# Patient Record
Sex: Male | Born: 1960 | Race: White | Hispanic: No | Marital: Married | Smoking: Never smoker
Health system: Southern US, Community
[De-identification: ages and names within clinical notes are randomized; demographics above are authoritative.]

## PROBLEM LIST (undated history)

## (undated) DIAGNOSIS — K219 Gastro-esophageal reflux disease without esophagitis: Secondary | ICD-10-CM

## (undated) DIAGNOSIS — I677 Cerebral arteritis, not elsewhere classified: Secondary | ICD-10-CM

## (undated) DIAGNOSIS — E079 Disorder of thyroid, unspecified: Secondary | ICD-10-CM

## (undated) DIAGNOSIS — E785 Hyperlipidemia, unspecified: Secondary | ICD-10-CM

---

## 2010-08-22 ENCOUNTER — Emergency Department (HOSPITAL_BASED_OUTPATIENT_CLINIC_OR_DEPARTMENT_OTHER): Payer: BC Managed Care – PPO

## 2010-08-22 ENCOUNTER — Emergency Department (INDEPENDENT_AMBULATORY_CARE_PROVIDER_SITE_OTHER): Payer: BC Managed Care – PPO

## 2010-08-22 ENCOUNTER — Emergency Department (HOSPITAL_BASED_OUTPATIENT_CLINIC_OR_DEPARTMENT_OTHER)
Admission: EM | Admit: 2010-08-22 | Discharge: 2010-08-22 | Disposition: A | Discharge status: Other Acute Inpatient Hospital | Payer: BC Managed Care – PPO | Attending: Emergency Medicine | Admitting: Emergency Medicine

## 2010-08-22 DIAGNOSIS — K859 Acute pancreatitis without necrosis or infection, unspecified: Secondary | ICD-10-CM | POA: Insufficient documentation

## 2010-08-22 DIAGNOSIS — R1013 Epigastric pain: Secondary | ICD-10-CM | POA: Insufficient documentation

## 2010-08-22 DIAGNOSIS — R0789 Other chest pain: Secondary | ICD-10-CM | POA: Insufficient documentation

## 2010-08-22 DIAGNOSIS — E7150 Peroxisomal disorder, unspecified: Secondary | ICD-10-CM | POA: Insufficient documentation

## 2010-08-22 DIAGNOSIS — R0609 Other forms of dyspnea: Secondary | ICD-10-CM | POA: Insufficient documentation

## 2010-08-22 DIAGNOSIS — E039 Hypothyroidism, unspecified: Secondary | ICD-10-CM | POA: Insufficient documentation

## 2010-08-22 DIAGNOSIS — R0989 Other specified symptoms and signs involving the circulatory and respiratory systems: Secondary | ICD-10-CM | POA: Insufficient documentation

## 2010-08-22 DIAGNOSIS — K219 Gastro-esophageal reflux disease without esophagitis: Secondary | ICD-10-CM | POA: Insufficient documentation

## 2010-08-22 DIAGNOSIS — R079 Chest pain, unspecified: Secondary | ICD-10-CM

## 2010-08-22 LAB — COMPREHENSIVE METABOLIC PANEL
ALT: 84 U/L — ABNORMAL HIGH (ref 0–53)
AST: 51 U/L — ABNORMAL HIGH (ref 0–37)
Albumin: 4.9 g/dL (ref 3.5–5.2)
CO2: 27 mEq/L (ref 19–32)
Calcium: 9.5 mg/dL (ref 8.4–10.5)
Chloride: 106 mEq/L (ref 96–112)
GFR calc Af Amer: >60 mL/min (ref >60)
GFR calc non Af Amer: >60 mL/min (ref >60)
Sodium: 145 mEq/L (ref 135–145)
Total Bilirubin: 0.7 mg/dL (ref 0.3–1.2)

## 2010-08-22 LAB — DIFFERENTIAL
Basophils Absolute: 0 10*3/uL (ref 0.0–0.1)
Basophils Relative: 0 % (ref 0–1)
Eosinophils Absolute: 0.2 10*3/uL (ref 0.0–0.7)
Neutro Abs: 4 10*3/uL (ref 1.7–7.7)
Neutrophils Relative %: 55 % (ref 43–77)

## 2010-08-22 LAB — POCT CARDIAC MARKERS
CKMB, poc: 1.2 ng/mL (ref 1.0–8.0)
Troponin i, poc: <0.05 ng/mL (ref 0.00–0.09)

## 2010-08-22 LAB — CBC
Hemoglobin: 15.4 g/dL (ref 13.0–17.0)
Platelets: 185 10*3/uL (ref 150–400)
RBC: 4.98 MIL/uL (ref 4.22–5.81)

## 2012-09-04 ENCOUNTER — Encounter (HOSPITAL_BASED_OUTPATIENT_CLINIC_OR_DEPARTMENT_OTHER): Payer: Self-pay | Admitting: Emergency Medicine

## 2012-09-04 ENCOUNTER — Emergency Department (HOSPITAL_BASED_OUTPATIENT_CLINIC_OR_DEPARTMENT_OTHER)
Admission: EM | Admit: 2012-09-04 | Discharge: 2012-09-04 | Disposition: A | Discharge status: Home / Self Care | Payer: BC Managed Care – PPO | Attending: Emergency Medicine | Admitting: Emergency Medicine

## 2012-09-04 DIAGNOSIS — Y9239 Other specified sports and athletic area as the place of occurrence of the external cause: Secondary | ICD-10-CM | POA: Insufficient documentation

## 2012-09-04 DIAGNOSIS — S0101XA Laceration without foreign body of scalp, initial encounter: Secondary | ICD-10-CM

## 2012-09-04 DIAGNOSIS — Y9364 Activity, baseball: Secondary | ICD-10-CM | POA: Insufficient documentation

## 2012-09-04 DIAGNOSIS — E079 Disorder of thyroid, unspecified: Secondary | ICD-10-CM | POA: Insufficient documentation

## 2012-09-04 DIAGNOSIS — Z23 Encounter for immunization: Secondary | ICD-10-CM | POA: Insufficient documentation

## 2012-09-04 DIAGNOSIS — Z79899 Other long term (current) drug therapy: Secondary | ICD-10-CM | POA: Insufficient documentation

## 2012-09-04 DIAGNOSIS — S0100XA Unspecified open wound of scalp, initial encounter: Secondary | ICD-10-CM | POA: Insufficient documentation

## 2012-09-04 DIAGNOSIS — K219 Gastro-esophageal reflux disease without esophagitis: Secondary | ICD-10-CM | POA: Insufficient documentation

## 2012-09-04 DIAGNOSIS — W219XXA Striking against or struck by unspecified sports equipment, initial encounter: Secondary | ICD-10-CM | POA: Insufficient documentation

## 2012-09-04 DIAGNOSIS — E785 Hyperlipidemia, unspecified: Secondary | ICD-10-CM | POA: Insufficient documentation

## 2012-09-04 HISTORY — DX: Gastro-esophageal reflux disease without esophagitis: K21.9

## 2012-09-04 HISTORY — DX: Disorder of thyroid, unspecified: E07.9

## 2012-09-04 HISTORY — DX: Hyperlipidemia, unspecified: E78.5

## 2012-09-04 MED ORDER — TETANUS-DIPHTH-ACELL PERTUSSIS 5-2.5-18.5 LF-MCG/0.5 IM SUSP
0.5000 mL | Freq: Once | INTRAMUSCULAR | Status: AC
  Administered 2012-09-04: 0.5 mL via INTRAMUSCULAR
  Filled 2012-09-04: qty 0.5

## 2012-09-04 MED ORDER — TRAMADOL HCL 50 MG PO TABS
50.0000 mg | ORAL_TABLET | Freq: Four times a day (QID) | ORAL | Status: AC | PRN
Start: 2012-09-04 — End: ?

## 2012-09-04 NOTE — ED Notes (Signed)
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

## 2012-09-04 NOTE — ED Notes (Signed)
Pt denies loc, bleeding controlled, pt laughing talking in triage

## 2012-09-04 NOTE — ED Provider Notes (Signed)
History  This chart was scribed for Loren Racer, MD by Bennett Scrape, ED Scribe. This patient was seen in room MH03/MH03 and the patient's care was started at 8:23 PM.  CSN: [id number]  Arrival date & time 09/04/12  1946   First MD Initiated Contact with Patient 09/04/12 2023      Chief Complaint  Patient presents with  . Head Injury    Patient is a 52 y.o. male presenting with head injury. The history is provided by the patient. No language interpreter was used.  Head Injury Location:  L parietal Time since incident:  4 hours Mechanism of injury: sports   Pain details:    Severity:  No pain Chronicity:  New Ineffective treatments:  Pressure Associated symptoms: no headaches, no nausea, no neck pain and no vomiting     Derrick Dominguez is a 52 y.o. male who presents to the Emergency Department complaining of head injury after being hit in the head by a baseball that occurred 4 hours ago. 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 He denies LOC and states that he was up and moving around immediately after the incident. He has a bleeding wound to the left parietal region of the scalp but denies neck pain, HA, nausea and emesis currently. TD status is unknown. He has a h/o HLD, thyroid disease and GERD. He denies smoking and alcohol use.   Past Medical History  Diagnosis Date  . Hyperlipidemia   . Thyroid disease   . GERD (gastroesophageal reflux disease)     History reviewed. No pertinent past surgical history.  History reviewed. No pertinent family history.  History  Substance Use Topics  . Smoking status: Never Smoker   . Smokeless tobacco: Not on file  . Alcohol Use: No      Review of Systems  HENT: Negative for neck pain.   Gastrointestinal: Negative for nausea and vomiting.  Skin: Positive for wound (to the vertex of his head).  Neurological: Negative for syncope, speech difficulty, weakness and headaches.   All other systems reviewed and are negative.    Allergies  Compazine and Statins  Home Medications   Current Outpatient Rx  Name  Route  Sig  Dispense  Refill  . ezetimibe (ZETIA) 10 MG tablet   Oral   Take 10 mg by mouth daily.         Marland Kitchen levothyroxine (SYNTHROID, LEVOTHROID) 25 MCG tablet   Oral   Take 25 mcg by mouth daily.         Marland Kitchen omega-3 acid ethyl esters (LOVAZA) 1 G capsule   Oral   Take 2 g by mouth 2 (two) times daily.         Marland Kitchen omeprazole (PRILOSEC) 10 MG capsule   Oral   Take 10 mg by mouth daily.         . traMADol (ULTRAM) 50 MG tablet   Oral   Take 1 tablet (50 mg total) by mouth every 6 (six) hours as needed for pain.   15 tablet   0     There were no vitals taken for this visit.  Physical Exam  Nursing note and vitals reviewed. Constitutional: He is oriented to person, place, and time. He appears well-developed and well-nourished. No distress.  HENT:  Head: Normocephalic.  2 cm laceration to the left parietal, no signs of contamination   Eyes: Conjunctivae and EOM are normal. Pupils are  equal, round, and reactive to light.  Neck: Normal range of motion. Neck supple. No tracheal deviation present.  Cardiovascular: Normal rate.   Pulmonary/Chest: Effort normal. No respiratory distress.  Musculoskeletal: Normal range of motion.  Neurological: He is alert and oriented to person, place, and time.  Moves all extremities, 5/5 strength in all extremities, sensation intact  Skin: Skin is warm and dry.  Psychiatric: He has a normal mood and affect. His behavior is normal.    ED Course  Procedures (including critical care time)  DIAGNOSTIC STUDIES: None performed    COORDINATION OF CARE: 8:33 PM-Had extensive conversation addressing pt's concerns and given signs to return for concussion and infection. Discussed treatment plan which includes 3 staples with pt at bedside and pt agreed to plan. Advised to return in one week for staple  removal.  LACERATION REPAIR PROCEDURE NOTE The patient's identification was confirmed and consent was obtained. This procedure was performed by Loren Racer, MD at 8:37 PM. Site: left parietal region of the scalp Sterile procedures observed Anesthetic used (type and amt): none with pt's consent Suture type/size: staples Length: 2 cm # of Sutures: 3 Technique:well approximated Complexity Antibx ointment applied Tetanus ordered Site anesthetized, irrigated with NS, explored without evidence of foreign body, wound well approximated, site covered with dry, sterile dressing.  Patient tolerated procedure well without complications. Instructions for care discussed verbally and patient provided with additional written instructions for homecare and f/u.  Labs Reviewed - No data to display No results found.   1. Scalp laceration       MDM  I personally performed the services described in this documentation, which was scribed in my presence. The recorded information has been reviewed and is accurate.    Loren Racer, MD 09/04/12 [phone]

## 2012-09-04 NOTE — ED Notes (Signed)
Pt also takes lavalo daily for cholesterol

## 2017-09-27 ENCOUNTER — Encounter (HOSPITAL_BASED_OUTPATIENT_CLINIC_OR_DEPARTMENT_OTHER): Payer: Self-pay | Admitting: Emergency Medicine

## 2017-09-27 ENCOUNTER — Other Ambulatory Visit: Payer: Self-pay

## 2017-09-27 ENCOUNTER — Emergency Department (HOSPITAL_BASED_OUTPATIENT_CLINIC_OR_DEPARTMENT_OTHER)
Admission: EM | Admit: 2017-09-27 | Discharge: 2017-09-28 | Disposition: A | Discharge status: Home / Self Care | Payer: BLUE CROSS/BLUE SHIELD | Attending: Emergency Medicine | Admitting: Emergency Medicine

## 2017-09-27 ENCOUNTER — Emergency Department (HOSPITAL_BASED_OUTPATIENT_CLINIC_OR_DEPARTMENT_OTHER): Payer: BLUE CROSS/BLUE SHIELD

## 2017-09-27 DIAGNOSIS — R51 Headache: Secondary | ICD-10-CM | POA: Diagnosis not present

## 2017-09-27 DIAGNOSIS — R519 Headache, unspecified: Secondary | ICD-10-CM

## 2017-09-27 DIAGNOSIS — Z79899 Other long term (current) drug therapy: Secondary | ICD-10-CM | POA: Diagnosis not present

## 2017-09-27 HISTORY — DX: Cerebral arteritis, not elsewhere classified: I67.7

## 2017-09-27 LAB — CBC WITH DIFFERENTIAL/PLATELET
BASOS ABS: 0 10*3/uL (ref 0.0–0.1)
Basophils Relative: 0 %
EOS ABS: 0.2 10*3/uL (ref 0.0–0.7)
EOS PCT: 2 %
HCT: 41.7 % (ref 39.0–52.0)
Hemoglobin: 14.5 g/dL (ref 13.0–17.0)
LYMPHS ABS: 2 10*3/uL (ref 0.7–4.0)
LYMPHS PCT: 31 %
MCH: 31.3 pg (ref 26.0–34.0)
MCHC: 34.8 g/dL (ref 30.0–36.0)
MCV: 89.9 fL (ref 78.0–100.0)
Monocytes Absolute: 0.7 10*3/uL (ref 0.1–1.0)
Monocytes Relative: 11 %
Neutro Abs: 3.4 10*3/uL (ref 1.7–7.7)
Neutrophils Relative %: 56 %
PLATELETS: 144 10*3/uL — AB (ref 150–400)
RBC: 4.64 MIL/uL (ref 4.22–5.81)
RDW: 12.4 % (ref 11.5–15.5)
WBC: 6.3 10*3/uL (ref 4.0–10.5)

## 2017-09-27 LAB — COMPREHENSIVE METABOLIC PANEL
ALK PHOS: 49 U/L (ref 38–126)
ALT: 59 U/L (ref 17–63)
AST: 30 U/L (ref 15–41)
Albumin: 4.8 g/dL (ref 3.5–5.0)
Anion gap: 8 (ref 5–15)
BUN: 14 mg/dL (ref 6–20)
CALCIUM: 9.1 mg/dL (ref 8.9–10.3)
CHLORIDE: 104 mmol/L (ref 101–111)
CO2: 28 mmol/L (ref 22–32)
CREATININE: 1.11 mg/dL (ref 0.61–1.24)
GFR calc Af Amer: >60 mL/min (ref >60)
GFR calc non Af Amer: >60 mL/min (ref >60)
Glucose, Bld: 130 mg/dL — ABNORMAL HIGH (ref 65–99)
Potassium: 3.5 mmol/L (ref 3.5–5.1)
SODIUM: 140 mmol/L (ref 135–145)
Total Bilirubin: 0.4 mg/dL (ref 0.3–1.2)
Total Protein: 7.5 g/dL (ref 6.5–8.1)

## 2017-09-27 MED ORDER — IOPAMIDOL (ISOVUE-370) INJECTION 76%
100.0000 mL | Freq: Once | INTRAVENOUS | Status: AC | PRN
  Administered 2017-09-27: 100 mL via INTRAVENOUS

## 2017-09-27 MED ORDER — SODIUM CHLORIDE 0.9 % IV BOLUS (SEPSIS)
1000.0000 mL | Freq: Once | INTRAVENOUS | Status: AC
  Administered 2017-09-27: 1000 mL via INTRAVENOUS

## 2017-09-27 NOTE — ED Notes (Signed)
Alert, NAD, calm, interactive, resps e/u, speaking in clear complete sentences, no dyspnea noted, skin W&D, VSS, c/o HA, onset Wednesday, worse at onset, better now, fluctuates in intensity, 3/10 now, usual trigeminal HA on R, this left sided head pain is new, has neurologist (denies: other sx including, other pain, sob, numbness, tingling, NVD, fever, dizziness or visual changes). [facility]

## 2017-09-27 NOTE — ED Triage Notes (Signed)
Patient states that 2 days ago he had 2 "thunderclaps" from the headache. He reports that he called his neurologist, because he was starting to feel like he is getting a "squeezing" feeling to his left head.

## 2017-09-27 NOTE — ED Provider Notes (Signed)
[facility] EMERGENCY DEPARTMENT Provider Note   CSN: [id number] Arrival date & time: 09/27/17  2010     History   Chief Complaint Chief Complaint  Patient presents with  . Headache    HPI Derrick Dominguez is a 57 y.o. male.  Patient is a 57 year old male with a history of thyroid disease, hyperlipidemia, cerebral vasculitis, prior cluster headaches and trigeminal neuralgia mostly on the right side of his head presenting today with intermittent headaches since Wednesday.  Patient states on Wednesday he had 2 episodes of severe pain in the left side of his head that felt like somebody was squeezing to the point it crippled him and he could not move.  He had 2 episodes back to back that lasted a minute or 2 at the most.  He states once the pain started relieving it was associated with a burning sensation.  Patient states that he did not have another episode until today.  He talked with his neurologist who recommended he come and be evaluated.  Patient states that he constantly has pain on the right side of his face from the cluster headaches and trigeminal neuralgia.  He takes gabapentin for this but that has not helped the pain on the left side.  Currently the pain is minimal and just a mild squeezing.  He denies any vision changes, nausea, vomiting, fever, unilateral weakness, numbness or speech changes.  He denies any neck pain, new trauma.  no recent medication changes, lack of sleep or increased stress.  Patient has no known history of clotting disorders and he has had prior MRIs and CT/CTA without acute findings in the last 4 years.   The history is provided by the patient and the spouse.  Headache   This is a recurrent problem. The current episode started 2 days ago. The problem has been rapidly improving. The headache is associated with nothing. The pain is located in the left unilateral region. The quality of the pain is described as throbbing (squeezing). The pain is at a  severity of 3/10. The pain is mild (earlier it was severe.). The pain does not radiate. Pertinent negatives include no anorexia, no fever, no palpitations, no syncope, no shortness of breath, no nausea and no vomiting. Treatments tried: gabapentin. The treatment provided no relief.    Past Medical History:  Diagnosis Date  . Cerebral vasculitis   . GERD (gastroesophageal reflux disease)   . Hyperlipidemia   . Thyroid disease     There are no active problems to display for this patient.   History reviewed. No pertinent surgical history.     Home Medications    Prior to Admission medications   Medication Sig Start Date End Date Taking? Authorizing Provider  gabapentin (NEURONTIN) 300 MG capsule Take 300 mg by mouth 3 (three) times daily.   Yes [provider]  Pitavastatin Calcium (LIVALO) 2 MG TABS Take by mouth.   Yes [provider]  ezetimibe (ZETIA) 10 MG tablet Take 10 mg by mouth daily.    [provider]  levothyroxine (SYNTHROID, LEVOTHROID) 25 MCG tablet Take 25 mcg by mouth daily.    [provider]  omega-3 acid ethyl esters (LOVAZA) 1 G capsule Take 2 g by mouth 2 (two) times daily.    [provider]  omeprazole (PRILOSEC) 10 MG capsule Take 10 mg by mouth daily.    [provider]  traMADol (ULTRAM) 50 MG tablet Take 1 tablet (50 mg total) by mouth every  6 (six) hours as needed for pain. 09/04/12   Loren Racer, MD    Family History History reviewed. No pertinent family history.  Social History Social History   Tobacco Use  . Smoking status: Never Smoker  . Smokeless tobacco: Never Used  Substance Use Topics  . Alcohol use: No  . Drug use: Not on file     Allergies   Verapamil; Compazine [prochlorperazine]; and Statins   Review of Systems Review of Systems  Constitutional: Negative for fever.  Respiratory: Negative for shortness of breath.   Cardiovascular: Negative for palpitations and  syncope.  Gastrointestinal: Negative for anorexia, nausea and vomiting.  Neurological: Positive for headaches.  All other systems reviewed and are negative.    Physical Exam Updated Vital Signs BP (!) 138/92   Pulse 62   Temp 98.1 F (36.7 C) (Oral)   Resp 18   Ht 5\' 6"  (1.676 m)   Wt 76.2 kg (168 lb)   SpO2 96%   BMI 27.12 kg/m   Physical Exam  Constitutional: He is oriented to person, place, and time. He appears well-developed and well-nourished. No distress.  HENT:  Head: Normocephalic and atraumatic.  Mouth/Throat: Oropharynx is clear and moist.  No temporal artery tenderness.  No rashes to the scalp and the location of pain  Eyes: Conjunctivae and EOM are normal. Pupils are equal, round, and reactive to light.  Neck: Normal range of motion. Neck supple. No JVD present. No neck rigidity. No Brudzinski's sign and no Kernig's sign noted.  Cardiovascular: Normal rate, regular rhythm and intact distal pulses.  No murmur heard. Pulmonary/Chest: Effort normal and breath sounds normal. No respiratory distress. He has no wheezes. He has no rales.  Abdominal: Soft. He exhibits no distension. There is no tenderness. There is no rebound and no guarding.  Musculoskeletal: Normal range of motion. He exhibits no edema or tenderness.  Neurological: He is alert and oriented to person, place, and time. He has normal strength. No cranial nerve deficit or sensory deficit. Gait normal.  Skin: Skin is warm and dry. No rash noted. No erythema.  Psychiatric: He has a normal mood and affect. His behavior is normal.  Nursing note and vitals reviewed.    ED Treatments / Results  Labs (all labs ordered are listed, but only abnormal results are displayed) Labs Reviewed  CBC WITH DIFFERENTIAL/PLATELET - Abnormal; Notable for the following components:      Result Value   Platelets 144 (*)    All other components within normal limits  COMPREHENSIVE METABOLIC PANEL - Abnormal; Notable for the  following components:   Glucose, Bld 130 (*)    All other components within normal limits    EKG  EKG Interpretation None       Radiology Ct Angio Head W Or Wo Contrast  Result Date: 09/27/2017 CLINICAL DATA:  Left-sided headache EXAM: CT ANGIOGRAPHY HEAD TECHNIQUE: Multidetector CT imaging of the head was performed using the standard protocol during bolus administration of intravenous contrast. Multiplanar CT image reconstructions and MIPs were obtained to evaluate the vascular anatomy. CONTRAST:  ISOVUE-370 IOPAMIDOL (ISOVUE-370) INJECTION 76% COMPARISON:  None. FINDINGS: CT HEAD Brain: No mass lesion, intraparenchymal hemorrhage or extra-axial collection. No evidence of acute cortical infarct. Normal appearance of the brain parenchyma and extra axial spaces for age. Vascular: No hyperdense vessel or unexpected vascular calcification. Skull: Normal visualized skull base, calvarium and extracranial soft tissues. Sinuses/Orbits: No sinus fluid levels or advanced mucosal thickening. No mastoid effusion. Normal orbits.  CTA HEAD FINDINGS Anterior circulation: --Intracranial internal carotid arteries: Normal. --Anterior cerebral arteries: Normal. --Middle cerebral arteries: Normal. --Posterior communicating arteries: Absent bilaterally. Posterior circulation: --Basilar artery: Normal. --Posterior cerebral arteries: Normal. --Superior cerebellar arteries: Normal. --Inferior cerebellar arteries: Normal anterior and posterior inferior cerebellar arteries. Venous sinuses: As permitted by contrast timing, patent. Anatomic variants: None Delayed phase: No parenchymal contrast enhancement. Review of the MIP images confirms the above findings. IMPRESSION: Normal CTA of the head. Electronically Signed   By: Deatra Robinson M.D.   On: 09/27/2017 23:57    Procedures Procedures (including critical care time)  Medications Ordered in ED Medications  sodium chloride 0.9 % bolus 1,000 mL (1,000 mLs  Intravenous New Bag/Given 09/27/17 2239)  iopamidol (ISOVUE-370) 76 % injection 100 mL (100 mLs Intravenous Contrast Given 09/27/17 2252)     Initial Impression / Assessment and Plan / ED Course  I have reviewed the triage vital signs and the nursing notes.  Pertinent labs & imaging results that were available during my care of the patient were reviewed by me and considered in my medical decision making (see chart for details).     Patient with a history of chronic headaches with multiple presentations of cluster headaches, trigeminal neuralgia that have been ongoing for years who is currently on gabapentin resenting today with severe sporadic headache on the left side of his face and head which is new.  He has had 3 attacks over the last 3 days and currently just has a dull ache in his head.  He spoke with his neurologist at [facility] who recommended he be evaluated.  Patient has had CTA, CT of the head and MRIs without any acute findings in the past.  Patient is neurologically intact on exam.  He has no temporal artery tenderness concerning for vasculitis.  He denies any infectious symptoms and he has no neck pain concerning for vertebral artery dissection.  He has no rashes concerning for zoster.  Could be that this is development of cluster headaches or trigeminal neuralgia on the left side however we will do a CTA to rule out new aneurysm or bleed.  Patient refusing any medication for the headache at this time.  He took a gabapentin prior to arrival.  12:16 AM CTA is neg for acute pathology.  Pt stable for d/c and f/u with his neurologist.  Final Clinical Impressions(s) / ED Diagnoses   Final diagnoses:  Acute intractable headache, unspecified headache type    ED Discharge Orders    None       Gwyneth Sprout, MD 09/28/17 [phone]

## 2017-09-27 NOTE — ED Notes (Signed)
Ambulatory with steady gait to CT

## 2017-09-27 NOTE — ED Notes (Signed)
EDP into room, prior to RN assessment, see MD notes, pending orders.   

## 2017-09-28 NOTE — ED Notes (Addendum)
[facility] and [facility]/c [facility]. No changes. Alert, NAD, calm, "feel better than previously". Wife present. Steady gait.

## 2019-04-16 IMAGING — CT CT ANGIO HEAD
1 of 14 series · 5 of 33 positions shown · IV contrast (APPLIED)
Comparison: None.

CLINICAL DATA: Left-sided headache

EXAM:
CT ANGIOGRAPHY HEAD
TECHNIQUE: Multidetector CT imaging of the head was performed using the
standard protocol during bolus administration of intravenous
contrast. Multiplanar CT image reconstructions and MIPs were
obtained to evaluate the vascular anatomy.
CONTRAST:  100mL 2XG1LQ-ZR1 IOPAMIDOL (2XG1LQ-ZR1) INJECTION 76%

[Series 11: axial thin · axial · 0.39mm/px · z∈[-128,-12]mm · 5 of 175 slices shown]
[im 30/175  soft-tissue]
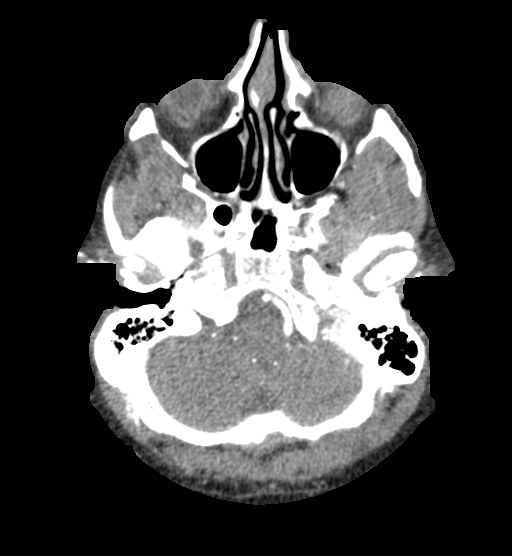
[im 59/175  bone]
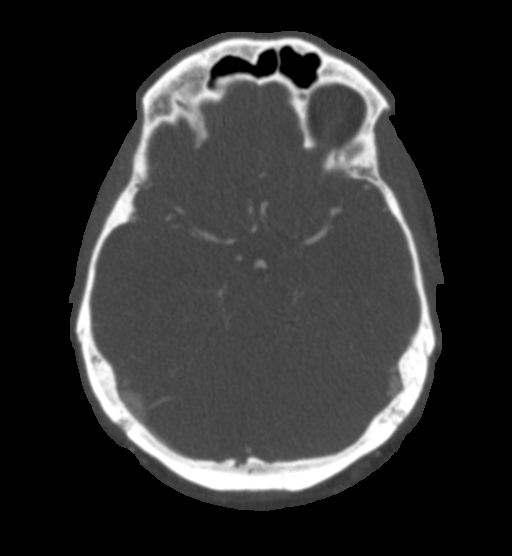
[im 88/175  soft-tissue]
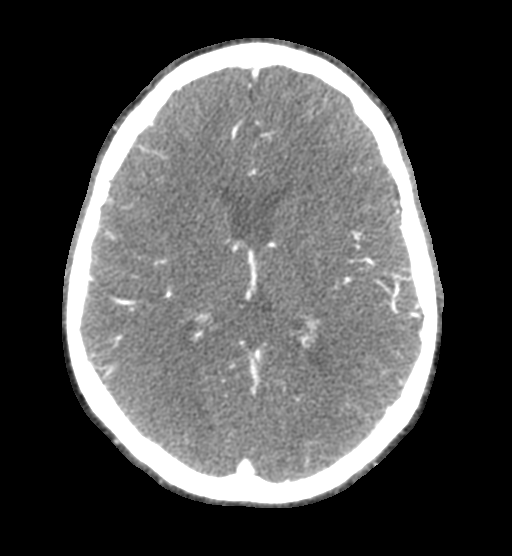
[im 117/175  bone]
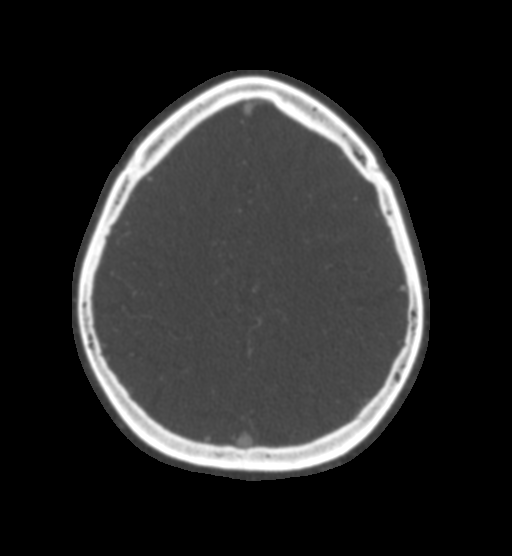
[im 146/175  soft-tissue]
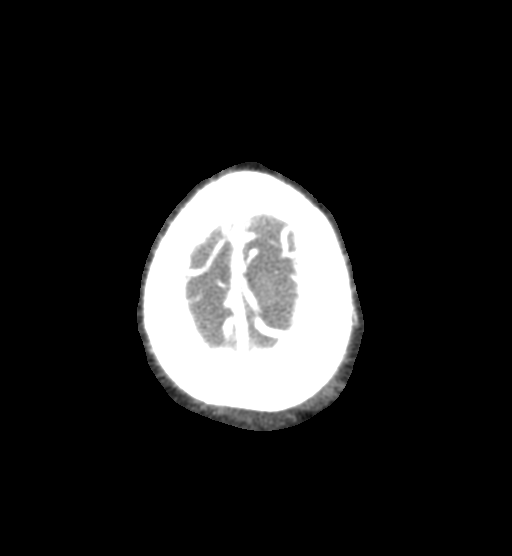

[5 of 33 positions shown; findings below may reference images not displayed]

FINDINGS: CT HEAD

Brain: No mass lesion, intraparenchymal hemorrhage or extra-axial
collection. No evidence of acute cortical infarct. Normal appearance
of the brain parenchyma and extra axial spaces for age.

Vascular: No hyperdense vessel or unexpected vascular calcification.

Skull: Normal visualized skull base, calvarium and extracranial soft
tissues.

Sinuses/Orbits: No sinus fluid levels or advanced mucosal
thickening. No mastoid effusion. Normal orbits.

CTA HEAD FINDINGS

Anterior circulation:

--Intracranial internal carotid arteries: Normal.

--Anterior cerebral arteries: Normal.

--Middle cerebral arteries: Normal.

--Posterior communicating arteries: Absent bilaterally.

Posterior circulation:

--Basilar artery: Normal.

--Posterior cerebral arteries: Normal.

--Superior cerebellar arteries: Normal.

--Inferior cerebellar arteries: Normal anterior and posterior
inferior cerebellar arteries.

Venous sinuses: As permitted by contrast timing, patent.

Anatomic variants: None

Delayed phase: No parenchymal contrast enhancement.

Review of the MIP images confirms the above findings.
IMPRESSION: Normal CTA of the head.
